# Patient Record
Sex: Male | Born: 1990 | Race: Black or African American | Hispanic: No | Marital: Single | Smoking: Current every day smoker
Health system: Southern US, Community
[De-identification: ages and names within clinical notes are randomized; demographics above are authoritative.]

---
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
## 2008-12-10 ENCOUNTER — Emergency Department (HOSPITAL_COMMUNITY): Admission: EM | Admit: 2008-12-10 | Discharge: 2008-12-10 | Payer: Self-pay | Admitting: Emergency Medicine

## 2020-06-18 ENCOUNTER — Other Ambulatory Visit: Payer: Self-pay

## 2020-06-18 ENCOUNTER — Emergency Department (HOSPITAL_COMMUNITY): Payer: Self-pay

## 2020-06-18 ENCOUNTER — Encounter (HOSPITAL_COMMUNITY): Payer: Self-pay | Admitting: Emergency Medicine

## 2020-06-18 ENCOUNTER — Emergency Department (HOSPITAL_COMMUNITY)
Admission: EM | Admit: 2020-06-18 | Discharge: 2020-06-18 | Disposition: A | Discharge status: Home / Self Care | Payer: Self-pay | Attending: Emergency Medicine | Admitting: Emergency Medicine

## 2020-06-18 DIAGNOSIS — Z5321 Procedure and treatment not carried out due to patient leaving prior to being seen by health care provider: Secondary | ICD-10-CM | POA: Insufficient documentation

## 2020-06-18 DIAGNOSIS — R0989 Other specified symptoms and signs involving the circulatory and respiratory systems: Secondary | ICD-10-CM | POA: Insufficient documentation

## 2020-06-18 DIAGNOSIS — R0981 Nasal congestion: Secondary | ICD-10-CM | POA: Insufficient documentation

## 2020-06-18 DIAGNOSIS — R05 Cough: Secondary | ICD-10-CM | POA: Insufficient documentation

## 2020-06-18 NOTE — ED Notes (Addendum)
Pt informed non-clinical staff he checked out.

## 2020-06-18 NOTE — ED Triage Notes (Signed)
Patient reports productive cough with chest congestion and nasal congestion this week , no fever or chills .

## 2020-07-08 ENCOUNTER — Telehealth (HOSPITAL_COMMUNITY): Payer: Self-pay

## 2020-07-21 ENCOUNTER — Encounter (HOSPITAL_COMMUNITY): Payer: Self-pay | Admitting: Psychiatry

## 2020-07-21 ENCOUNTER — Other Ambulatory Visit: Payer: Self-pay

## 2020-07-21 ENCOUNTER — Ambulatory Visit (INDEPENDENT_AMBULATORY_CARE_PROVIDER_SITE_OTHER): Payer: No Payment, Other | Admitting: Psychiatry

## 2020-07-21 DIAGNOSIS — F4310 Post-traumatic stress disorder, unspecified: Secondary | ICD-10-CM | POA: Insufficient documentation

## 2020-07-21 DIAGNOSIS — F39 Unspecified mood [affective] disorder: Secondary | ICD-10-CM | POA: Insufficient documentation

## 2020-07-21 MED ORDER — CITALOPRAM HYDROBROMIDE 10 MG PO TABS: 10.0000 mg | ORAL_TABLET | Freq: Every day | ORAL | 1 refills | Status: DC

## 2020-07-21 MED ORDER — ARIPIPRAZOLE 5 MG PO TABS: 5.0000 mg | ORAL_TABLET | Freq: Every day | ORAL | 1 refills | Status: DC

## 2020-07-21 NOTE — Progress Notes (Signed)
Psychiatric Initial Adult Assessment   Patient Identification: [name] MRN:  [id number] Date of Evaluation:  07/21/2020   Referral Source: Vesta Mixer  Chief Complaint:   " I want to be back on my Adderall. Xanax helped me great as well."  Visit Diagnosis:    ICD-10-CM   1. Unspecified mood (affective) disorder (HCC)  F39 ARIPiprazole (ABILIFY) 5 MG tablet    citalopram (CELEXA) 10 MG tablet  2. PTSD (post-traumatic stress disorder)  F43.10 citalopram (CELEXA) 10 MG tablet    History of Present Illness:  Patient presents today for initial psychiatric evaluation for medication management.  Patient was referred to outpatient psychiatry from [facility].  Patient reports a history of ADHD and PTSD.  Patient reports that he has recently experienced mood swings, increased irritability, hearing voices of people that are not there, paranoia about people saying things about him, being easily startled by loud noises, and feeling like he is always being watched.   Patient also endorses experiencing moments when he becomes so upset that he "blacks out" and he is unable to control his anger.  Patient reports that he is primarily concerned about his irritability and mood swings today because he is currently on house arrest after serving 3 years in prison and he does not want to go back to prison due to his uncontrollable behaviors.  He reports that he was laid off from his job due to an angry outburst towards another staff member while working.  He reports that he has not been on any medications since being released from prison in February 2021. He stated that he was prescribed Hydroxyzine and Remeron when he was in prison but they did not help him much.  Patient reported that he took Celexa in the past which was effective for his mood and anxiety.   Provider offered to start patient on Celexa and Abilify to manage his mood swings and reduce his irritability, auditory hallucinations, and paranoia.  Patient is agreeable with starting Celexa and Abilify medications at this time.  Patient reports a history of daily cocaine use and use of ecstasy in the past.  Patient reports that he has not used any cocaine in 3 weeks.  However, patient reports that he currently smokes marijuana.  Patient encouraged to discontinue marijuana use and continue abstinence from other illicit substance use.   Patient endorses difficulty concentrating and reports that in the past he was prescribed Adderall medication for ADHD.  Patient would like to be prescribed Adderall medication at this time. Provider discussed in depth with the patient about the contraindication to prescribe addictive controlled substances such as Adderall due to his history of illicit substance use.  Patient reports that he would still like to be prescribed Adderall because if he does not receive the prescription for the medication, he may seek the medication elsewhere.  Provider discussed with the patient that prescribing this medication is contraindicated at this time.  Patient then verbalized his understanding and reported that he may be willing to try something else.    In addition, patient reported that he has been diagnosed with lung cancer and he would like to be referred to oncology.  Provider discussed with the patient that this type of referral is usually made by PCP. Patient stated that he does not have a PCP at this time, he was then provided with information for [facility] to schedule an appointment for further evaluation and referral.     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  Substance Abuse History in the last 12 months:  Yes.    Consequences of Substance Abuse: Negative  Past Medical History: No past medical history on file. No past surgical  history on file.  Family Psychiatric History: denied  Family History: No family history on file.  Social History:   Social History   Socioeconomic History  . Marital status: Single    Spouse name: Not on file  . Number of children: Not on file  . Years of education: Not on file  . Highest education level: Not on file  Occupational History  . Not on file  Tobacco Use  . Smoking status: Never Smoker  . Smokeless tobacco: Never Used  Substance and Sexual Activity  . Alcohol use: Never  . Drug use: Yes    Types: Marijuana  . Sexual activity: Not on file  Other Topics Concern  . Not on file  Social History Narrative  . Not on file   Social Determinants of Health   Financial Resource Strain:   . Difficulty of Paying Living Expenses:   Food Insecurity:   . Worried About Programme researcher, broadcasting/film/video in the Last Year:   . Barista in the Last Year:   Transportation Needs:   . Freight forwarder (Medical):   Marland Kitchen Lack of Transportation (Non-Medical):   Physical Activity:   . Days of Exercise per Week:   . Minutes of Exercise per Session:   Stress:   . Feeling of Stress :   Social Connections:   . Frequency of Communication with Friends and Family:   . Frequency of Social Gatherings with Friends and Family:   . Attends Religious Services:   . Active Member of Clubs or Organizations:   . Attends Banker Meetings:   Marland Kitchen Marital Status:     Additional Social History: Currently on house arrest  Allergies:  No Known Allergies  Metabolic Disorder Labs: No results found for: HGBA1C, MPG No results found for: PROLACTIN No results found for: CHOL, TRIG, HDL, CHOLHDL, VLDL, LDLCALC No results found for: TSH  Therapeutic Level Labs: No results found for: LITHIUM No results found for: CBMZ No results found for: VALPROATE  Current Medications: Current Outpatient Medications  Medication Sig Dispense Refill  . ARIPiprazole (ABILIFY) 5 MG tablet Take 1 tablet (5  mg total) by mouth daily. 30 tablet 1  . citalopram (CELEXA) 10 MG tablet Take 1 tablet (10 mg total) by mouth daily. 30 tablet 1   No current facility-administered medications for this visit.    Musculoskeletal: Strength & Muscle Tone: within normal limits Gait & Station: normal Patient leans: N/A  Psychiatric Specialty Exam: Review of Systems  There were no vitals taken for this visit.There is no height or weight on file to calculate BMI.  General Appearance: Fairly Groomed  Eye Contact:  Good  Speech:  Clear and Coherent and Normal Rate  Volume:  Normal  Mood:  Euthymic  Affect:  Congruent  Thought Process:  Goal Directed and Descriptions of Associations: Intact  Orientation:  Full (Time, Place, and Person)  Thought Content:  Logical, fixated on getting prescription for Adderall.  Suicidal Thoughts:  No  Homicidal Thoughts:  No  Memory:  Immediate;   Good Recent;   Good  Judgement:  Fair  Insight:  Lacking  Psychomotor Activity:  Normal  Concentration:  Concentration: Good and Attention Span: Good  Recall:  Good  Fund of Knowledge:Good  Language: Good  Akathisia:  Negative  Handed:  Right  AIMS (if indicated):  not done  Assets:  Communication Skills Desire for Improvement Financial Resources/Insurance Housing Social Support  ADL's:  Intact  Cognition: WNL  Sleep:  Good     Assessment and Plan: 29 year old male with history of unspecified mood disorder and PTSD now coming in and requesting prescription for Adderall as he claims that his attention span is very poor and he did not complete any tasks.  He also reported mood swings and irritability.  He is currently on house arrest after getting out of prison in February after spending 3 years there.  He stated that he would like to try medication that can help him keep his temper under control so that he does not get in any further trouble.  He was recommended trial of Abilify to help with mood swings and also Celexa.   He reported that he likes it the past and it was helpful with his mood. Potential side effects of medication and risks vs benefits of treatment vs non-treatment were explained and discussed. All questions were answered.  Patient was informed that he will not be prescribed Adderall due to his history of substance abuse.  1. Unspecified mood (affective) disorder (HCC) - ARIPiprazole (ABILIFY) 5 MG tablet; Take 1 tablet (5 mg total) by mouth daily.  Dispense: 30 tablet; Refill: 1 - citalopram (CELEXA) 10 MG tablet; Take 1 tablet (10 mg total) by mouth daily.  Dispense: 30 tablet; Refill: 1  2. PTSD (post-traumatic stress disorder) - citalopram (CELEXA) 10 MG tablet; Take 1 tablet (10 mg total) by mouth daily.  Dispense: 30 tablet; Refill: 1  Follow-up in 6 weeks.  Zena Amos, MD 7/26/20211:22 PM

## 2020-08-12 ENCOUNTER — Ambulatory Visit (INDEPENDENT_AMBULATORY_CARE_PROVIDER_SITE_OTHER): Payer: Self-pay | Admitting: Student

## 2020-08-12 ENCOUNTER — Other Ambulatory Visit: Payer: Self-pay

## 2020-08-12 ENCOUNTER — Encounter: Payer: Self-pay | Admitting: Student

## 2020-08-12 VITALS — BP 127/78 | HR 64 | Temp 98.4°F | Ht 69.0 in | Wt 243.1 lb

## 2020-08-12 DIAGNOSIS — F39 Unspecified mood [affective] disorder: Secondary | ICD-10-CM

## 2020-08-12 DIAGNOSIS — R05 Cough: Secondary | ICD-10-CM

## 2020-08-12 DIAGNOSIS — F4310 Post-traumatic stress disorder, unspecified: Secondary | ICD-10-CM

## 2020-08-12 DIAGNOSIS — R059 Cough, unspecified: Secondary | ICD-10-CM

## 2020-08-12 NOTE — Assessment & Plan Note (Addendum)
[name] has had a multiple episodes of cough with black phlegm over the course of 7 months. He notes worsening night sweats the last two months. He denies weight loss or other systemic symptoms such as fever, chills. He states he was told while incarcerated that he had lung cancer and was told to follow up with [facility], but he states he never followed up. Patient continues to have episodes of cough with black phlegm. Patient stated he was tested, multiple times while in prison and prior to being released, for tuberculosis via PPD and was never told if he was positive.   Patient was seen in the ED for chest pain on 06/21, chest x-ray was performed and the impression was as stated: "The heart size and mediastinal contours are within normal limits. Both lungs are clear. The visualized skeletal structures are unremarkable." I also reviewed the chest x-ray and did not appreciate any cardiopulmonary disease. No evidence of infiltrated with or without cavitary lesions. Patient's pulmonary exam today was unremarkable   Differential includes tuberculosis, lung cancer, pulmonary infectious etiology, autoimmune disorders. Patient's history of smoking cigarettes, marijuana, synthetic cannabinoids, and prior use of cocaine may also be contributing to his symptoms.   A/P - Will wait to review patient's medical records from [facility]. He does not currently have insurance and do not believe it would be beneficial to repeat labs, imaging, etc that was already performed. Process to retrieve records began today. Patient signed medical release paperwork to be faxed to [facility] - Once these records are reviewed and depending on what tests were already performed and what cancer was suspected, we will consider repeating PPD and ordering Chest CT as well as further evaluation.

## 2020-08-12 NOTE — Progress Notes (Signed)
   CC: Cough  HPI:  [name] is a 29 y.o. M who presents to the clinic today for evaluation of a cough with black phlegm. The patient states this began in 01/2020 when he began coughing up black phlegm while he was incarcerated. The patient notes being worked up by the medical staff at the [facility] and states he was diagnosed with cancer and given a referral to [facility]. He denies following up with this appointment. He states since then, he has not coughed up black phlegm until recently. He notes prior to being released from prison he was given a TB skin test that was negative and he was tested monthly while he was in prison. He notes continuing to have the cough and that at times he would have chest pain. He also notes shortness of breath that improved when he was able to cough up phlegm.   He denies coughing up light red or dark red tinted or streaked phlegm. He states he has a history of night sweats that has worsened over the past two months. He denies weight loss, fever, or chills.   Patient notes he has a history of smoking 10 cigarettes for 2 week intervals prior to being incarcerated 15 years ago. He notes smoking marijuana 4 times a day.   Patient states he has a PMHx of PTSD and depression, he is currently followed by [facility]   No past medical history on file.   Review of Systems:  As Per HPI  Physical Exam:  Vitals:   08/12/20 1011  BP: 127/78  Pulse: 64  Temp: 98.4 F (36.9 C)  TempSrc: Oral  SpO2: 99%  Weight: 243 lb 1.6 oz (110.3 kg)  Height: 5\' 9"  (1.753 m)   Physical Exam Vitals and nursing note reviewed.  Constitutional:      General: He is not in acute distress.    Appearance: Normal appearance. He is not ill-appearing or toxic-appearing.  HENT:     Head: Atraumatic.  Eyes:     Extraocular Movements: Extraocular movements intact.  Cardiovascular:     Rate and Rhythm: Normal rate and regular rhythm.      Pulses: Normal pulses.     Heart sounds: Normal heart sounds. No murmur heard.  No friction rub. No gallop.   Pulmonary:     Effort: Pulmonary effort is normal. No respiratory distress.     Breath sounds: Normal breath sounds. No stridor. No wheezing, rhonchi or rales.  Musculoskeletal:     Cervical back: Normal range of motion.  Neurological:     General: No focal deficit present.     Mental Status: He is alert and oriented to person, place, and time. Mental status is at baseline.  Psychiatric:        Mood and Affect: Mood normal.        Behavior: Behavior normal.     Assessment & Plan:   See Encounters Tab for problem based charting.  Patient discussed with and seen by Dr. 

## 2020-08-12 NOTE — Patient Instructions (Signed)
Thank you for allowing [place] to assist in your care today! It was nice to meet you.   Today we discussed:  1) Cough - We will be retrieving the records from [facility]. Once I review these records we will call you with the next step in our plan.  - If you have any worsening cough, fever, chills, worsening chest pain, please go to your closest emergency room or call 911  2) Pyschiatry Referral - I will be putting in a referral for psychiatry for you to follow up with.   If you have any questions or concerns please do not hesitate to call!

## 2020-08-12 NOTE — Assessment & Plan Note (Signed)
Patient requested to see different psychologist for his PTSD and depression  A/P  - Psych referral in place - Continue medications of celexa and abilify.

## 2020-08-14 NOTE — Progress Notes (Signed)
Internal Medicine Clinic Attending  I saw and evaluated the patient.  I personally confirmed the key portions of the history and exam documented by Dr. Katsadouros and I reviewed pertinent patient test results.  The assessment, diagnosis, and plan were formulated together and I agree with the documentation in the resident's note.  

## 2020-08-29 ENCOUNTER — Ambulatory Visit (HOSPITAL_COMMUNITY): Payer: No Payment, Other | Admitting: Psychiatry

## 2020-09-04 ENCOUNTER — Telehealth: Payer: Self-pay | Admitting: *Deleted

## 2020-09-04 NOTE — Telephone Encounter (Signed)
TC to patient, he is asking if we ever received him medical records from [facility] (Pt had an office visit on 08/12/20).  This RN did not see any records in media. Pt states if we did not get records, he would like to come in and get the labwork he and the MD spoke about at LOV. Will forward to yellow team and ask Mamie to call for records. Thank you, SChaplin, RN,BSN

## 2020-09-04 NOTE — Telephone Encounter (Signed)
Please call patient about getting labs per the patient's girlfriend.

## 2020-09-04 NOTE — Telephone Encounter (Signed)
[name] will call patient to confirm if he has signed the release of information form. If not, he can come in to sign or we can send the form out.

## 2020-09-05 ENCOUNTER — Telehealth: Payer: Self-pay

## 2020-09-10 ENCOUNTER — Encounter: Payer: Self-pay | Admitting: Licensed Clinical Social Worker

## 2020-09-15 ENCOUNTER — Encounter: Payer: Self-pay | Admitting: Internal Medicine

## 2020-09-15 ENCOUNTER — Other Ambulatory Visit: Payer: Self-pay

## 2020-09-15 ENCOUNTER — Telehealth: Payer: Self-pay | Admitting: *Deleted

## 2020-09-15 NOTE — Telephone Encounter (Signed)
Patient's fiance called in stating patient's cough is no better. Coughing all night long. Denies SHOB. Wondering if we have received lab results from [facility]. These are not in Media. Appt given this afternoon with [facility] Team as Yellow Team has no openings. She will call back if unable to keep this appt. Kinnie Feil, BSN, RN-BC

## 2020-09-15 NOTE — Assessment & Plan Note (Deleted)
Pt reporting dx of lung cancer while incarcerated.

## 2020-09-15 NOTE — Telephone Encounter (Signed)
Ok. Will discuss at the visit today. Thanks

## 2020-09-15 NOTE — Telephone Encounter (Signed)
Spoke with [name] at [facility]. States she does not have any records of lab results, imaging, or immunizations which means they were not performed at the [facility]. Attending made aware. Kinnie Feil, BSN, RN-BC

## 2020-09-15 NOTE — Telephone Encounter (Signed)
ROI faxed again to [facility] at [phone]. Kinnie Feil, BSN, RN-BC

## 2020-09-15 NOTE — Telephone Encounter (Signed)
Per Victorino Dike they only have a TB record, will be faxing that over. [phone]

## 2020-10-29 NOTE — Telephone Encounter (Signed)
closed

## 2020-11-11 ENCOUNTER — Encounter: Payer: Self-pay | Admitting: Student

## 2020-11-11 ENCOUNTER — Telehealth: Payer: Self-pay | Admitting: *Deleted

## 2020-11-11 NOTE — Telephone Encounter (Signed)
Call to patient at both available numbers.  Home number male answered said patient did not live there.  Message left on Mobile number that Clinics had called about missed appointment.   Angelina Ok, RN 11/11/2020 4:03 PM.

## 2021-01-26 ENCOUNTER — Ambulatory Visit: Payer: Self-pay | Admitting: Internal Medicine

## 2021-02-03 ENCOUNTER — Ambulatory Visit: Payer: Self-pay | Admitting: Internal Medicine

## 2021-06-06 ENCOUNTER — Emergency Department (HOSPITAL_COMMUNITY)
Admission: EM | Admit: 2021-06-06 | Discharge: 2021-06-07 | Disposition: A | Discharge status: Home / Self Care | Payer: Self-pay | Attending: Emergency Medicine | Admitting: Emergency Medicine

## 2021-06-06 ENCOUNTER — Other Ambulatory Visit: Payer: Self-pay

## 2021-06-06 DIAGNOSIS — Z5321 Procedure and treatment not carried out due to patient leaving prior to being seen by health care provider: Secondary | ICD-10-CM | POA: Insufficient documentation

## 2021-06-06 DIAGNOSIS — Z202 Contact with and (suspected) exposure to infections with a predominantly sexual mode of transmission: Secondary | ICD-10-CM | POA: Insufficient documentation

## 2021-06-06 LAB — URINALYSIS, ROUTINE W REFLEX MICROSCOPIC
Bacteria, UA: NONE SEEN
Bilirubin Urine: NEGATIVE
Glucose, UA: NEGATIVE mg/dL
Hgb urine dipstick: NEGATIVE
Ketones, ur: NEGATIVE mg/dL
Nitrite: NEGATIVE
Protein, ur: NEGATIVE mg/dL
Specific Gravity, Urine: 1.017 (ref 1.005–1.030)
pH: 6 (ref 5.0–8.0)

## 2021-06-06 LAB — HIV ANTIBODY (ROUTINE TESTING W REFLEX): HIV Screen 4th Generation wRfx: NONREACTIVE

## 2021-06-06 NOTE — ED Provider Notes (Signed)
Emergency Medicine Provider Triage Evaluation Note  [name] , Little 29 y.o. male  was evaluated in triage.  Pt complains of requesting STD testing.  No symptoms. No known exposures  Review of Systems  Positive: STD test Negative: Hematuria, dysuria, rash, lesions, vaginal discharge  Physical Exam  There were no vitals taken for this visit. Gen:   Awake, no distress   Resp:  Normal effort  MSK:   Moves extremities without difficulty  Other:    Medical Decision Making  Medically screening exam initiated at 8:42 PM.  Appropriate orders placed.  [name] was informed that the remainder of the evaluation will be completed by another provider, this initial triage assessment does not replace that evaluation, and the importance of remaining in the ED until their evaluation is complete.  STD testing currently asymptomatic    Henry Ruff A, PA-C 06/06/21 2042    Henry Core, MD 06/07/21 1056

## 2021-06-06 NOTE — ED Triage Notes (Signed)
Pt arrive POV, requesting STD check.

## 2021-06-07 LAB — RPR: RPR Ser Ql: NONREACTIVE

## 2021-08-13 ENCOUNTER — Ambulatory Visit (INDEPENDENT_AMBULATORY_CARE_PROVIDER_SITE_OTHER): Payer: No Payment, Other | Admitting: Physician Assistant

## 2021-08-13 ENCOUNTER — Other Ambulatory Visit: Payer: Self-pay

## 2021-08-13 VITALS — BP 126/87 | HR 63 | Ht 69.0 in | Wt 235.0 lb

## 2021-08-13 DIAGNOSIS — F99 Mental disorder, not otherwise specified: Secondary | ICD-10-CM

## 2021-08-13 DIAGNOSIS — F39 Unspecified mood [affective] disorder: Secondary | ICD-10-CM

## 2021-08-13 DIAGNOSIS — F5105 Insomnia due to other mental disorder: Secondary | ICD-10-CM | POA: Diagnosis not present

## 2021-08-13 DIAGNOSIS — F4310 Post-traumatic stress disorder, unspecified: Secondary | ICD-10-CM | POA: Diagnosis not present

## 2021-08-13 DIAGNOSIS — G4700 Insomnia, unspecified: Secondary | ICD-10-CM | POA: Insufficient documentation

## 2021-08-13 DIAGNOSIS — F411 Generalized anxiety disorder: Secondary | ICD-10-CM | POA: Diagnosis not present

## 2021-08-13 MED ORDER — FLUOXETINE HCL 10 MG PO CAPS
10.0000 mg | ORAL_CAPSULE | Freq: Every day | ORAL | 0 refills | Status: DC
  Filled 2021-08-13: qty 30, 30d supply, fill #0

## 2021-08-13 MED ORDER — TRAZODONE HCL 50 MG PO TABS
25.0000 mg | ORAL_TABLET | Freq: Every day | ORAL | 1 refills | Status: DC
  Filled 2021-08-13: qty 15, 15d supply, fill #0
  Filled 2021-09-07 – 2021-09-22 (×2): qty 15, 15d supply, fill #1

## 2021-08-13 MED ORDER — ARIPIPRAZOLE 5 MG PO TABS
5.0000 mg | ORAL_TABLET | Freq: Every day | ORAL | 1 refills | Status: DC
  Filled 2021-08-13: qty 30, 30d supply, fill #0
  Filled 2021-09-07 – 2021-11-02 (×2): qty 30, 30d supply, fill #1

## 2021-08-13 MED ORDER — FLUOXETINE HCL 20 MG PO CAPS
20.0000 mg | ORAL_CAPSULE | Freq: Every day | ORAL | 1 refills | Status: DC
  Filled 2021-08-13: qty 30, 30d supply, fill #0
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
## 2021-08-15 ENCOUNTER — Encounter (HOSPITAL_COMMUNITY): Payer: Self-pay | Admitting: Physician Assistant

## 2021-08-28 ENCOUNTER — Telehealth (HOSPITAL_COMMUNITY): Payer: Self-pay | Admitting: Physician Assistant

## 2021-08-28 NOTE — Telephone Encounter (Signed)
Patient reports 3 episodes of hallucinations since starting trazadone. Patient says his mind is all over the place, and his dream state is not real sleep it is scary hallucinations fighting things and people and the same when awake. Patient requesting different medication that he used to take as a child.

## 2021-09-04 ENCOUNTER — Other Ambulatory Visit (HOSPITAL_COMMUNITY): Payer: Self-pay | Admitting: Physician Assistant

## 2021-09-04 DIAGNOSIS — F4310 Post-traumatic stress disorder, unspecified: Secondary | ICD-10-CM

## 2021-09-04 DIAGNOSIS — F39 Unspecified mood [affective] disorder: Secondary | ICD-10-CM

## 2021-09-04 DIAGNOSIS — F411 Generalized anxiety disorder: Secondary | ICD-10-CM

## 2021-09-04 DIAGNOSIS — F909 Attention-deficit hyperactivity disorder, unspecified type: Secondary | ICD-10-CM

## 2021-09-04 MED ORDER — FLUOXETINE HCL 10 MG PO CAPS
10.0000 mg | ORAL_CAPSULE | Freq: Every day | ORAL | 1 refills | Status: DC
  Filled 2021-09-04 – 2021-09-07 (×2): qty 30, 30d supply, fill #0

## 2021-09-04 MED ORDER — ATOMOXETINE HCL 40 MG PO CAPS
40.0000 mg | ORAL_CAPSULE | Freq: Every day | ORAL | 1 refills | Status: AC
  Filled 2021-09-04: qty 30, 30d supply, fill #0

## 2021-09-04 NOTE — Telephone Encounter (Signed)
Provider was contacted by Lauralee Evener regarding patient's concern over taking trazodone. Provider contacted patient to discuss his concerns. Patient reports that he has had no issues with his trazodone and that it has been helpful with his sleep. Patient expresses that he thinks his fluoxetine may be too strong. Patient was instructed to taper back down on his dose of Fluoxetine from 20 mg to 10 mg.  Patient also expresses concern over his symptoms related to ADHD. Provider recommended patient start out on Strattera 40 mg daily for the manage of his ADHD. Patient is agreeable to recommendation. Patient's medication to be e-prescribed to pharmacy of choice.

## 2021-09-04 NOTE — Progress Notes (Signed)
Provider contacted patient and patient's medication regimen was adjusted accordingly. Patient's medication to be e-prescribed to pharmacy of choice.

## 2021-09-07 ENCOUNTER — Ambulatory Visit (INDEPENDENT_AMBULATORY_CARE_PROVIDER_SITE_OTHER): Payer: No Payment, Other | Admitting: Licensed Clinical Social Worker

## 2021-09-07 ENCOUNTER — Other Ambulatory Visit: Payer: Self-pay

## 2021-09-07 DIAGNOSIS — F4310 Post-traumatic stress disorder, unspecified: Secondary | ICD-10-CM | POA: Diagnosis not present
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
## 2021-09-14 ENCOUNTER — Other Ambulatory Visit: Payer: Self-pay

## 2021-09-22 ENCOUNTER — Other Ambulatory Visit: Payer: Self-pay

## 2021-09-23 ENCOUNTER — Encounter (HOSPITAL_COMMUNITY): Payer: No Payment, Other | Admitting: Physician Assistant

## 2021-09-24 ENCOUNTER — Telehealth (HOSPITAL_COMMUNITY): Payer: Self-pay | Admitting: Physician Assistant

## 2021-09-24 NOTE — Telephone Encounter (Signed)
Per spouse, patient became incarcerated on 09/21/21. Wife concerned of side effects of medication.  Wife phone number [phone]

## 2021-10-06 ENCOUNTER — Encounter (HOSPITAL_COMMUNITY): Payer: No Payment, Other | Admitting: Physician Assistant

## 2021-10-08 NOTE — Telephone Encounter (Signed)
Provider was contacted by Lauralee Evener and Orpah Clinton. Reola Calkins, RN regarding message left by this patient's wife. Message is in relation to patient's medication side effects. Provider will reach out to patient's wife regarding concerns.

## 2021-10-14 ENCOUNTER — Encounter (HOSPITAL_COMMUNITY): Payer: Self-pay | Admitting: Physician Assistant

## 2021-10-14 ENCOUNTER — Other Ambulatory Visit: Payer: Self-pay

## 2021-10-14 ENCOUNTER — Telehealth (INDEPENDENT_AMBULATORY_CARE_PROVIDER_SITE_OTHER): Payer: No Payment, Other | Admitting: Physician Assistant

## 2021-10-14 DIAGNOSIS — F411 Generalized anxiety disorder: Secondary | ICD-10-CM | POA: Diagnosis not present

## 2021-10-14 DIAGNOSIS — G4700 Insomnia, unspecified: Secondary | ICD-10-CM

## 2021-10-14 DIAGNOSIS — F39 Unspecified mood [affective] disorder: Secondary | ICD-10-CM

## 2021-10-14 DIAGNOSIS — F4310 Post-traumatic stress disorder, unspecified: Secondary | ICD-10-CM | POA: Diagnosis not present

## 2021-10-14 DIAGNOSIS — F909 Attention-deficit hyperactivity disorder, unspecified type: Secondary | ICD-10-CM | POA: Diagnosis not present

## 2021-10-14 MED ORDER — FLUOXETINE HCL 40 MG PO CAPS
40.0000 mg | ORAL_CAPSULE | Freq: Every day | ORAL | 1 refills | Status: DC
  Filled 2021-10-14 – 2022-01-07 (×2): qty 30, 30d supply, fill #0

## 2021-10-14 MED ORDER — AMITRIPTYLINE HCL 25 MG PO TABS
25.0000 mg | ORAL_TABLET | Freq: Every day | ORAL | 1 refills | Status: DC
  Filled 2021-10-14: qty 60, 33d supply, fill #0

## 2021-10-14 MED ORDER — LAMOTRIGINE 25 MG PO TABS
25.0000 mg | ORAL_TABLET | Freq: Every day | ORAL | 1 refills | Status: AC
  Filled 2021-10-14: qty 30, 30d supply, fill #0

## 2021-10-14 MED ORDER — AMPHETAMINE-DEXTROAMPHETAMINE 5 MG PO TABS: 5.0000 mg | ORAL_TABLET | Freq: Every day | ORAL | 0 refills | Status: DC
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
## 2021-10-20 ENCOUNTER — Ambulatory Visit (HOSPITAL_COMMUNITY)
Admission: EM | Admit: 2021-10-20 | Discharge: 2021-10-20 | Discharge status: Absent without leave | Payer: No Payment, Other

## 2021-10-21 ENCOUNTER — Telehealth (HOSPITAL_COMMUNITY): Payer: Self-pay

## 2021-10-21 NOTE — BH Assessment (Signed)
Care Management - Follow Up [facility] Discharges   Writer attempted to make contact with patient today and was unsuccessful.  Writer left a HIPPA compliant voice message.   

## 2021-10-30 ENCOUNTER — Other Ambulatory Visit: Payer: Self-pay

## 2021-11-02 ENCOUNTER — Other Ambulatory Visit: Payer: Self-pay

## 2021-11-04 ENCOUNTER — Ambulatory Visit: Payer: Self-pay | Admitting: Nurse Practitioner

## 2021-11-09 ENCOUNTER — Ambulatory Visit (HOSPITAL_COMMUNITY): Payer: No Payment, Other | Admitting: Licensed Clinical Social Worker

## 2021-11-10 ENCOUNTER — Encounter: Payer: Self-pay | Admitting: Nurse Practitioner

## 2021-11-10 NOTE — Progress Notes (Signed)
No answer. LVM ?

## 2021-11-10 NOTE — Progress Notes (Signed)
No answer on home number. Unable to leave VM

## 2021-11-11 ENCOUNTER — Telehealth (HOSPITAL_COMMUNITY): Payer: Self-pay | Admitting: Physician Assistant

## 2021-11-11 ENCOUNTER — Other Ambulatory Visit (HOSPITAL_COMMUNITY): Payer: Self-pay | Admitting: Physician Assistant

## 2021-11-11 DIAGNOSIS — F909 Attention-deficit hyperactivity disorder, unspecified type: Secondary | ICD-10-CM

## 2021-11-11 NOTE — Telephone Encounter (Signed)
Patient called requesting refills on medication amphetamine-dextroamphetamine (ADDERALL) 5 MG tablet Wants to discuss increasing medications.
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
## 2021-11-13 ENCOUNTER — Other Ambulatory Visit (HOSPITAL_COMMUNITY): Payer: Self-pay | Admitting: Physician Assistant

## 2021-11-13 DIAGNOSIS — F909 Attention-deficit hyperactivity disorder, unspecified type: Secondary | ICD-10-CM

## 2021-11-13 MED ORDER — AMPHETAMINE-DEXTROAMPHETAMINE 5 MG PO TABS: 5.0000 mg | ORAL_TABLET | Freq: Every day | ORAL | 0 refills | Status: DC

## 2021-11-13 NOTE — Telephone Encounter (Signed)
Patient present to clinic requesting that Adderall dosage be adjusted. Patient's prescription to remain the same. Provider to contact patient to discuss other alternatives to medications.
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
## 2021-11-17 ENCOUNTER — Telehealth (HOSPITAL_COMMUNITY): Payer: Self-pay | Admitting: Physician Assistant

## 2021-11-17 ENCOUNTER — Other Ambulatory Visit (HOSPITAL_COMMUNITY): Payer: Self-pay | Admitting: Physician Assistant

## 2021-11-17 DIAGNOSIS — F909 Attention-deficit hyperactivity disorder, unspecified type: Secondary | ICD-10-CM

## 2021-11-17 MED ORDER — AMPHETAMINE-DEXTROAMPHETAMINE 5 MG PO TABS: 5.0000 mg | ORAL_TABLET | Freq: Every day | ORAL | 0 refills | Status: AC

## 2021-11-17 NOTE — Telephone Encounter (Signed)
[name] is Out of stock of ADDERALL at [address]. Pt requesting provider send to see [facility] on [address] or [address].  Pt [phone].

## 2021-11-17 NOTE — Progress Notes (Signed)
Provider was contacted by Orpah Clinton. Reola Calkins, RN and Lauralee Evener regarding patient's request for medication be sent to different pharmacy. Patient's medication to be e-prescribed to preferred pharmacy.

## 2021-11-17 NOTE — Telephone Encounter (Signed)
Provider was contacted by Suzanne K. Beck, RN and Carla Bracken regarding patient's request for medication be sent to different pharmacy. Patient's medication to be e-prescribed to preferred pharmacy. 

## 2021-11-23 ENCOUNTER — Other Ambulatory Visit: Payer: Self-pay

## 2021-11-23 ENCOUNTER — Ambulatory Visit (INDEPENDENT_AMBULATORY_CARE_PROVIDER_SITE_OTHER): Payer: No Payment, Other | Admitting: Licensed Clinical Social Worker

## 2021-11-23 DIAGNOSIS — F4310 Post-traumatic stress disorder, unspecified: Secondary | ICD-10-CM | POA: Diagnosis not present
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
## 2021-11-26 ENCOUNTER — Telehealth (HOSPITAL_COMMUNITY): Payer: Self-pay | Admitting: Physician Assistant

## 2021-11-26 NOTE — Telephone Encounter (Signed)
Provider was contacted by Lauralee Evener regarding patient's message. Provider to contact patient to discuss medication options.

## 2021-11-26 NOTE — Telephone Encounter (Signed)
Patient stopped by to apologize to provider for "speaking to him like that, I truly apologize."  Pt request ask Link Snuffer to call him [phone] to discuss medication adjustments.

## 2021-11-27 ENCOUNTER — Emergency Department (HOSPITAL_COMMUNITY)
Admission: EM | Admit: 2021-11-27 | Discharge: 2021-11-27 | Disposition: A | Discharge status: Home / Self Care | Payer: Self-pay | Attending: Emergency Medicine | Admitting: Emergency Medicine

## 2021-11-27 ENCOUNTER — Encounter (HOSPITAL_COMMUNITY): Payer: Self-pay | Admitting: Emergency Medicine

## 2021-11-27 DIAGNOSIS — Z008 Encounter for other general examination: Secondary | ICD-10-CM

## 2021-11-27 DIAGNOSIS — Z79899 Other long term (current) drug therapy: Secondary | ICD-10-CM | POA: Insufficient documentation

## 2021-11-27 DIAGNOSIS — F17210 Nicotine dependence, cigarettes, uncomplicated: Secondary | ICD-10-CM | POA: Insufficient documentation

## 2021-11-27 DIAGNOSIS — Z0279 Encounter for issue of other medical certificate: Secondary | ICD-10-CM | POA: Insufficient documentation

## 2021-11-27 MED ORDER — DIPHENHYDRAMINE HCL 50 MG/ML IJ SOLN
50.0000 mg | Freq: Once | INTRAMUSCULAR | Status: AC
  Administered 2021-11-27: 50 mg via INTRAMUSCULAR
  Filled 2021-11-27: qty 1

## 2021-11-27 MED ORDER — LORAZEPAM 2 MG/ML IJ SOLN
2.0000 mg | Freq: Once | INTRAMUSCULAR | Status: AC
  Administered 2021-11-27: 2 mg via INTRAMUSCULAR
  Filled 2021-11-27: qty 1

## 2021-11-27 NOTE — ED Notes (Signed)
RN able to talk to pt calmly. Pt seems agreeable to obtain vitals. Pt c/o of restraints being too tight. RN assessed. Pt hand looked slightly swollen at this time, RN removed tighter 2nd set of restraints. Pt states, " that's much better". While RN in another pt room, pt able to take off 1st set of restraints. Pt eloped.
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
## 2021-11-27 NOTE — ED Notes (Addendum)
Unable to obtain vitals, pt extremely combative, able to pull off lines even with violent restraints. RN will attempt to get again after pt medicated.
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
## 2021-11-27 NOTE — ED Notes (Signed)
Pt now resting. Normal rise and chest fall.

## 2021-11-27 NOTE — ED Notes (Signed)
Pt with [employer] and uncrooptive for vitals
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
## 2021-11-29 ENCOUNTER — Emergency Department (HOSPITAL_COMMUNITY): Payer: Self-pay

## 2021-11-29 ENCOUNTER — Other Ambulatory Visit: Payer: Self-pay

## 2021-11-29 ENCOUNTER — Emergency Department (HOSPITAL_COMMUNITY)
Admission: EM | Admit: 2021-11-29 | Discharge: 2021-11-29 | Disposition: A | Discharge status: Home / Self Care | Payer: Self-pay | Attending: Emergency Medicine | Admitting: Emergency Medicine

## 2021-11-29 DIAGNOSIS — R21 Rash and other nonspecific skin eruption: Secondary | ICD-10-CM | POA: Insufficient documentation

## 2021-11-29 DIAGNOSIS — R059 Cough, unspecified: Secondary | ICD-10-CM | POA: Insufficient documentation

## 2021-11-29 DIAGNOSIS — M79606 Pain in leg, unspecified: Secondary | ICD-10-CM | POA: Insufficient documentation

## 2021-11-29 DIAGNOSIS — Z79899 Other long term (current) drug therapy: Secondary | ICD-10-CM | POA: Insufficient documentation

## 2021-11-29 DIAGNOSIS — F17210 Nicotine dependence, cigarettes, uncomplicated: Secondary | ICD-10-CM | POA: Insufficient documentation

## 2021-11-29 LAB — COMPREHENSIVE METABOLIC PANEL
ALT: 32 U/L (ref 0–44)
AST: 53 U/L — ABNORMAL HIGH (ref 15–41)
Albumin: 4 g/dL (ref 3.5–5.0)
Alkaline Phosphatase: 67 U/L (ref 38–126)
Anion gap: 12 (ref 5–15)
BUN: 19 mg/dL (ref 6–20)
CO2: 24 mmol/L (ref 22–32)
Calcium: 9.1 mg/dL (ref 8.9–10.3)
Chloride: 100 mmol/L (ref 98–111)
Creatinine, Ser: 1.5 mg/dL — ABNORMAL HIGH (ref 0.61–1.24)
GFR, Estimated: >60 mL/min (ref >60)
Glucose, Bld: 94 mg/dL (ref 70–99)
Potassium: 4.1 mmol/L (ref 3.5–5.1)
Sodium: 136 mmol/L (ref 135–145)
Total Bilirubin: 1 mg/dL (ref 0.3–1.2)
Total Protein: 7.4 g/dL (ref 6.5–8.1)

## 2021-11-29 LAB — CBC WITH DIFFERENTIAL/PLATELET
Abs Immature Granulocytes: 0.05 10*3/uL (ref 0.00–0.07)
Basophils Absolute: 0 10*3/uL (ref 0.0–0.1)
Basophils Relative: 0 %
Eosinophils Absolute: 0 10*3/uL (ref 0.0–0.5)
Eosinophils Relative: 0 %
HCT: 44.7 % (ref 39.0–52.0)
Hemoglobin: 15.1 g/dL (ref 13.0–17.0)
Immature Granulocytes: 0 %
Lymphocytes Relative: 10 %
Lymphs Abs: 1.5 10*3/uL (ref 0.7–4.0)
MCH: 31.8 pg (ref 26.0–34.0)
MCHC: 33.8 g/dL (ref 30.0–36.0)
MCV: 94.1 fL (ref 80.0–100.0)
Monocytes Absolute: 0.9 10*3/uL (ref 0.1–1.0)
Monocytes Relative: 6 %
Neutro Abs: 12 10*3/uL — ABNORMAL HIGH (ref 1.7–7.7)
Neutrophils Relative %: 84 %
Platelets: 386 10*3/uL (ref 150–400)
RBC: 4.75 MIL/uL (ref 4.22–5.81)
RDW: 13.1 % (ref 11.5–15.5)
WBC: 14.5 10*3/uL — ABNORMAL HIGH (ref 4.0–10.5)
nRBC: 0 % (ref 0.0–0.2)

## 2021-11-29 LAB — SALICYLATE LEVEL: Salicylate Lvl: <7 mg/dL — ABNORMAL LOW (ref 7.0–30.0)

## 2021-11-29 LAB — RAPID URINE DRUG SCREEN, HOSP PERFORMED
Amphetamines: NOT DETECTED
Barbiturates: NOT DETECTED
Benzodiazepines: NOT DETECTED
Cocaine: POSITIVE — AB
Opiates: NOT DETECTED
Tetrahydrocannabinol: POSITIVE — AB

## 2021-11-29 LAB — URINALYSIS, ROUTINE W REFLEX MICROSCOPIC
Bilirubin Urine: NEGATIVE
Glucose, UA: NEGATIVE mg/dL
Hgb urine dipstick: NEGATIVE
Ketones, ur: 20 mg/dL — AB
Leukocytes,Ua: NEGATIVE
Nitrite: NEGATIVE
Protein, ur: NEGATIVE mg/dL
Specific Gravity, Urine: 1.027 (ref 1.005–1.030)
pH: 5 (ref 5.0–8.0)

## 2021-11-29 LAB — ETHANOL: Alcohol, Ethyl (B): <10 mg/dL (ref <10)

## 2021-11-29 LAB — ACETAMINOPHEN LEVEL: Acetaminophen (Tylenol), Serum: <10 ug/mL — ABNORMAL LOW (ref 10–30)

## 2021-11-29 MED ORDER — STERILE WATER FOR INJECTION IJ SOLN
INTRAMUSCULAR | Status: AC
  Filled 2021-11-29: qty 10

## 2021-11-29 MED ORDER — ZIPRASIDONE MESYLATE 20 MG IM SOLR
20.0000 mg | Freq: Once | INTRAMUSCULAR | Status: DC
  Filled 2021-11-29: qty 20

## 2021-11-29 NOTE — ED Notes (Signed)
Pt wife at bedside.

## 2021-11-29 NOTE — ED Notes (Signed)
Pt came out of room aggressively and questioning the sitter in the hall why he was looking at his wife. Pt redirected to go back in his room. Pt went back in room.
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
## 2021-11-29 NOTE — ED Provider Notes (Signed)
[facility] EMERGENCY DEPARTMENT Provider Note   CSN: [id number] Arrival date & time: 11/29/21  9735     History Chief Complaint  Patient presents with   Leg Pain    Verlin Uher is a 30 y.o. male.  30 year old male presents via EMS for evaluation.  Patient does have psychiatric history.  Patient reports he was walking through the entire city of [place] since yesterday 7 a.m. to 2 AM last night.  He reports he was walking across the city to bless the city.  He reports after he completed the walk his feet were hurting.  He also endorses a cough, and also states there is a rash near his right buttock that occurred after he received an injection on Friday.  He reports he received the injection at this hospital and believes it was a lethal injection.  He reports his wife gave [facility] consent to give him lethal injection.  He denies any SI, HI.  He does state he hears God's voice but denies any visual hallucinations.  Reports he is on psychiatric medicines and he is typically compliant with those medicines except he has not taken those the past couple days.  The history is provided by the patient. No language interpreter was used.      No past medical history on file.  Patient Active Problem List   Diagnosis Date Noted   Attention deficit hyperactivity disorder (ADHD) 09/04/2021   Generalized anxiety disorder 08/13/2021   Insomnia 08/13/2021   Cough 08/12/2020   Unspecified mood (affective) disorder (HCC) 07/21/2020   PTSD (post-traumatic stress disorder) 07/21/2020    No past surgical history on file.     No family history on file.  Social History   Tobacco Use   Smoking status: Every Day    Types: Cigarettes   Smokeless tobacco: Former   Tobacco comments:    3 per day   Substance Use Topics   Alcohol use: Not Currently   Drug use: Yes    Types: Marijuana    Home Medications Prior to Admission medications   Medication Sig Start  Date End Date Taking? Authorizing Provider  amitriptyline (ELAVIL) 25 MG tablet Take 1 tablet (25 mg total) at bedtime for 6 days then continue taking 2 tablets (50 mg total) at bedtime 10/14/21   Nwoko, Stephens Shire E, PA  amphetamine-dextroamphetamine (ADDERALL) 5 MG tablet Take 1 tablet (5 mg total) by mouth daily. 11/17/21 11/17/22  Nwoko, Tommas Olp, PA  ARIPiprazole (ABILIFY) 5 MG tablet Take 1 tablet (5 mg total) by mouth daily. 08/13/21   Nwoko, Tommas Olp, PA  atomoxetine (STRATTERA) 40 MG capsule Take 1 capsule (40 mg total) by mouth daily. 09/04/21 09/04/22  Nwoko, Tommas Olp, PA  FLUoxetine (PROZAC) 40 MG capsule Take 1 capsule (40 mg total) by mouth daily. 10/14/21 10/14/22  Nwoko, Tommas Olp, PA  lamoTRIgine (LAMICTAL) 25 MG tablet Take 1 tablet (25 mg total) by mouth daily. 10/14/21 10/14/22  Meta Hatchet, PA    Allergies    Patient has no known allergies.  Review of Systems   Review of Systems  Constitutional:  Negative for chills and fever.  Respiratory:  Positive for cough. Negative for shortness of breath.   Genitourinary:  Negative for dysuria.  Skin:  Positive for rash. Negative for wound.  Psychiatric/Behavioral:  Positive for hallucinations. Negative for agitation.   All other systems reviewed and are negative.  Physical Exam Updated Vital Signs BP (!) 138/95 (BP Location: Right  Arm)   Pulse 86   Temp 98.2 F (36.8 C)   Resp 18   SpO2 100%   Physical Exam Vitals and nursing note reviewed.  Constitutional:      General: He is not in acute distress.    Appearance: Normal appearance. He is not ill-appearing.  HENT:     Head: Normocephalic and atraumatic.     Nose: Nose normal.  Eyes:     General: No scleral icterus.    Extraocular Movements: Extraocular movements intact.     Conjunctiva/sclera: Conjunctivae normal.  Cardiovascular:     Rate and Rhythm: Normal rate and regular rhythm.     Pulses: Normal pulses.     Heart sounds: Normal heart sounds.  Pulmonary:      Effort: Pulmonary effort is normal. No respiratory distress.     Breath sounds: Normal breath sounds. No wheezing or rales.  Abdominal:     General: There is no distension.     Tenderness: There is no abdominal tenderness.  Musculoskeletal:        General: Normal range of motion.     Cervical back: Normal range of motion.  Skin:    General: Skin is warm and dry.     Comments: Without presence of rash at patient stated location of right buttocks.  Neurological:     General: No focal deficit present.     Mental Status: He is alert. Mental status is at baseline.    ED Results / Procedures / Treatments   Labs (all labs ordered are listed, but only abnormal results are displayed) Labs Reviewed  CBC WITH DIFFERENTIAL/PLATELET - Abnormal; Notable for the following components:      Result Value   WBC 14.5 (*)    Neutro Abs 12.0 (*)    All other components within normal limits  COMPREHENSIVE METABOLIC PANEL - Abnormal; Notable for the following components:   Creatinine, Ser 1.50 (*)    AST 53 (*)    All other components within normal limits  SALICYLATE LEVEL - Abnormal; Notable for the following components:   Salicylate Lvl <7.0 (*)    All other components within normal limits  ACETAMINOPHEN LEVEL - Abnormal; Notable for the following components:   Acetaminophen (Tylenol), Serum <10 (*)    All other components within normal limits  URINALYSIS, ROUTINE W REFLEX MICROSCOPIC - Abnormal; Notable for the following components:   APPearance HAZY (*)    Ketones, ur 20 (*)    All other components within normal limits  ETHANOL  RAPID URINE DRUG SCREEN, HOSP PERFORMED    EKG None  Radiology No results found.  Procedures Procedures   Medications Ordered in ED Medications - No data to display  ED Course  I have reviewed the triage vital signs and the nursing notes.  Pertinent labs & imaging results that were available during my care of the patient were reviewed by me and  considered in my medical decision making (see chart for details).    MDM Rules/Calculators/A&P                           30 year old male presents via EMS for evaluation.  Patient states he walked around the entire city of [place] to [place] the city.  Patient reports a rash which is not present on exam.  He does state he was given a lethal injection on Friday per his wife's consent.  He does have a cough and his white  count is elevated.  Will evaluate with chest x-ray, and respiratory panel.  We will also obtain UA.  Patient while awaiting further work-up and psychiatric evaluation left out of the emergency room AMA.  His chest x-ray was without pneumonia, and UA without UTI.  Patient is not IVC and is not homicidal or suicidal.  Final Clinical Impression(s) / ED Diagnoses Final diagnoses:  None    Rx / DC Orders ED Discharge Orders     None        Marita Kansas, PA-C 11/29/21 1001    Benjiman Core, MD 11/29/21 1919
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
## 2021-11-29 NOTE — ED Notes (Signed)
Pt stating he wanted to leave, provider notified and advised that was okay per [employer]. Pt was escorted out by security.

## 2021-11-29 NOTE — ED Notes (Signed)
Pt walking around yelling at this writer. Pt stated , "Do you want me to tell all of them about what y'all did to me yesterday?"

## 2021-11-29 NOTE — ED Notes (Signed)
Pt refusing covid swab. States he can't have a swab up his nose. Pt given [place] sandwich and drink.

## 2021-11-29 NOTE — ED Notes (Signed)
Pt came back out of room stating that "this woman makes me this way" pt obviously getting agitated and wife was asked to leave and she stated she would. Wife appeared scared.

## 2021-11-29 NOTE — ED Triage Notes (Signed)
Pt arrived by EMS for leg pain after walking since 7am. EMS reported depression, pt denies SI

## 2021-11-30 ENCOUNTER — Telehealth: Payer: Self-pay | Admitting: *Deleted

## 2021-11-30 NOTE — Telephone Encounter (Signed)
Pt wife called regarding pt being allowed to leave hospital AMA.  RNCM reviewed chart to find that " Patient had an ankle bracelet on for monitoring and had been issued a citation by police. Police left the department to follow after the patient. He was not under arrest or IVC." and therefore was able to leave AMA.

## 2021-12-02 ENCOUNTER — Ambulatory Visit (HOSPITAL_COMMUNITY)
Admission: EM | Admit: 2021-12-02 | Discharge: 2021-12-02 | Discharge status: Against Medical Advice | Payer: No Payment, Other

## 2021-12-02 DIAGNOSIS — F1410 Cocaine abuse, uncomplicated: Secondary | ICD-10-CM

## 2021-12-02 DIAGNOSIS — F39 Unspecified mood [affective] disorder: Secondary | ICD-10-CM

## 2021-12-02 DIAGNOSIS — F22 Delusional disorders: Secondary | ICD-10-CM

## 2021-12-02 NOTE — ED Provider Notes (Signed)
Behavioral Health Urgent Care Medical Screening Exam  Patient Name: [name] MRN: [id number] Date of Evaluation: 12/02/21 Chief Complaint:   Diagnosis:  Final diagnoses:  Unspecified mood (affective) disorder (HCC)  Paranoia (psychosis) (HCC)  Cocaine abuse (HCC)    History of Present illness: [name] is a 30 y.o. male.  Patient presents voluntarily to [facility] behavioral health for walk-in assessment.  He states "I am trying to get some help, I am trying to get some medicine that works."  [name] has been diagnosed with ADHD, PTSD, unspecified mood disorder, generalized anxiety disorder and insomnia.  He is followed by outpatient psychiatry at [facility] behavioral health.  He reports he is typically compliant with medications including Celexa, Prozac, Adderall and Abilify.  He stopped taking medications 4 days ago as he has misplaced his medications, after being incarcerated.  He reports frustration with outpatient provider as he "does not manage my Adderall right, I told him to up my milligrams, 5 mg is not enough."  Ruven endorses substance use, reports last use of marijuana, Adderall and cocaine on yesterday.  He denies alcohol use, he denies substance use aside from marijuana, Adderall and cocaine.  He reports he does not take Adderall as prescribed as he feels he needs to "double up the dose."  [name] reports recent stressors include 2 court dates, court upcoming on 12/03/2021 and 12/04/2021.  [name] presents with delusional thoughts, paranoid ideations and hyperreligiosity.  He reports he is currently homeless because his mother threw him out of her home and initiated a 50-B protective order approximately 1 week ago he states "I scared her, because I could read her mind."  He presents with delusional thoughts and tangential conversation, states "when I get mad I can change the weather, I am going to save the world because I am the son of  God."  He reports he was placed in jail 2 days ago after eluding police.  He reports he eluded police as he had to walk through the [place] in an effort to save the world.  [name] presents with hyperreligious conversation.  He states "I believe the Bible and I can help people."  Patient is assessed face-to-face by nurse practitioner.  He is seated in assessment area, no acute distress.  He is alert and oriented, cooperative during assessment.   He presents with labile mood, irritable affect. He denies suicidal and homicidal ideations.  He denies history of suicide attempts, denies history of self-harm.  He contracts verbally for safety with this Clinical research associate.   He denies both auditory and visual hallucinations.  Patient appears preoccupied with religious conversation.   [name] reports he is homeless for approximately 1 week after his mother would not permit him to return to her home.  He reports he has recently been residing with a friend who lives in a hotel.  He states "I will go back to my mom's tomorrow, she will remove the restraining order." He endorses average appetite and decreased sleep.  He endorses chronic insomnia. He discusses that only xanax has helped him sleep in the past.  Patient offered support and encouragement.  [name] insists that he needs to leave to "go smoke (cigarettes)." Attempted to review treatment plan including inpatient psychiatric hospitalization, patient demands to leave lobby.   Patient reviewed with outpatient provider who supports disposition of inpatient psychiatric treatment. Outpatient provider is aware of impulsive behavior and patient's recent involvement with local police related to behavioral outbursts.   Psychiatric Specialty Exam  Presentation  General Appearance:Appropriate for Environment; Casual  Eye Contact:Good  Speech:Clear and Coherent; Normal Rate  Speech Volume:Normal  Handedness:Right   Mood and Affect   Mood:Labile  Affect:Labile   Thought Process  Thought Processes:Coherent; Goal Directed  Descriptions of Associations:Tangential  Orientation:Full (Time, Place and Person)  Thought Content:Tangential; Paranoid Ideation; Abstract Reasoning  Diagnosis of Schizophrenia or Schizoaffective disorder in past: No  Duration of Psychotic Symptoms: Less than six months  Hallucinations:None  Ideas of Reference:Delusions; Paranoia  Suicidal Thoughts:No  Homicidal Thoughts:No   Sensorium  Memory:Recent Fair; Remote Fair  Judgment:Fair  Insight:Shallow   Executive Functions  Concentration:Fair  Attention Span:Fair  [name]  Language:Good   Psychomotor Activity  Psychomotor Activity:Normal   Assets  Assets:Communication Skills; Desire for Improvement; Physical Health; Resilience; Social Support   Sleep  Sleep:Poor  Number of hours: No data recorded  No data recorded  Physical Exam: Physical Exam Vitals and nursing note reviewed.  Constitutional:      Appearance: Normal appearance. He is well-developed.  HENT:     Head: Normocephalic and atraumatic.     Nose: Nose normal.  Cardiovascular:     Rate and Rhythm: Normal rate.  Pulmonary:     Effort: Pulmonary effort is normal.  Musculoskeletal:        General: Normal range of motion.     Cervical back: Normal range of motion.  Skin:    General: Skin is warm and dry.  Neurological:     Mental Status: He is alert and oriented to person, place, and time.  Psychiatric:        Attention and Perception: Attention normal.        Mood and Affect: Affect is labile.        Speech: Speech is tangential.        Behavior: Behavior is uncooperative.        Thought Content: Thought content is paranoid and delusional.        Cognition and Memory: Cognition normal.   Review of Systems  Constitutional: Negative.   HENT: Negative.    Eyes: Negative.   Respiratory: Negative.     Cardiovascular: Negative.   Gastrointestinal: Negative.   Genitourinary: Negative.   Musculoskeletal: Negative.   Skin: Negative.   Neurological: Negative.   Endo/Heme/Allergies: Negative.   Psychiatric/Behavioral:  Positive for substance abuse. The patient has insomnia.   Blood pressure 138/81, pulse 65, temperature 98.4 F (36.9 C), temperature source Oral, resp. rate 18, height 5\' 7"  (1.702 m), weight 222 lb (100.7 kg), SpO2 100 %. Body mass index is 34.77 kg/m.  Musculoskeletal: Strength & Muscle Tone: within normal limits Gait & Station: normal Patient leans: N/A   [facility] MSE Discharge Disposition for Follow up and Recommendations: Patient reviewed with Dr Serafina Mitchell. Inpatient psychiatric hospitalization recommended, Involuntary commitment petition completed, patient left [facility] prior to completion of involuntary commitment petition.    Lucky Rathke, FNP 12/02/2021, 11:38 AM

## 2021-12-02 NOTE — BH Assessment (Signed)
Pt reports getting out of jail 2 days ago due to "a disappearing act" or fleeing from police. Pt reports he spent 5 days in jail. Pt reports he has been off his medications for the past 3-4 days. Pt reports dx of PTSD, ADHD, bipolar schizophrenia and compulsive disorder. Pt reports he sees Eddie at [facility] but has not seen him in about a month. Pt reports he has court tomorrow for a 50B his mom placed on him due to arguments about the "Bible". Pt states that him and his mom got into a bad argument about a week and a half ago. Pt denies SI, HI, AVH and reports THC use yesterday. Pt reports not liking this time of year and states "fuck the world". Pt reports hx of AVH and reports that God talks to him but denies God telling him to do anything bad. Pt also reports hx of seeing aliens. Pt reports he lost his voice due to yelling while he was in jail. Pt reports that he recent received a shot from the hospital however he does not know what the shot was for. Pt asking to stay a couple of night to get back on his medications.    Pt is urgent

## 2021-12-07 ENCOUNTER — Ambulatory Visit (HOSPITAL_COMMUNITY): Payer: No Payment, Other | Admitting: Licensed Clinical Social Worker

## 2021-12-08 ENCOUNTER — Telehealth (HOSPITAL_COMMUNITY): Payer: Self-pay | Admitting: Physician Assistant

## 2021-12-08 NOTE — Telephone Encounter (Signed)
Patients sgo called to cancel future appts and he is unavailable to contact. She requested for his therapist and psychiatrist to contact her to discuss his treatment.

## 2021-12-10 ENCOUNTER — Telehealth (HOSPITAL_COMMUNITY): Payer: No Payment, Other | Admitting: Physician Assistant

## 2021-12-11 ENCOUNTER — Telehealth (HOSPITAL_COMMUNITY): Payer: Self-pay | Admitting: *Deleted

## 2021-12-11 ENCOUNTER — Telehealth (HOSPITAL_COMMUNITY): Payer: Self-pay

## 2021-12-11 NOTE — Telephone Encounter (Signed)
[name], sgo, is upset neither provider has returned her call regarding treatment plan after incarceration.

## 2021-12-11 NOTE — Telephone Encounter (Signed)
Patient's wife called stating patient has been incarcerated & will be released ~~ 01/04/22.  Patient's wife is a little disappointed that no one reached out to her after finding her husband has been put in jail. Patient's wife would like provider to reach to jail system to help coordinate medication care while incarcerated. Provider has been notified & has reached out to the wife.

## 2021-12-11 NOTE — BH Assessment (Signed)
Care Management - [facility] Follow Up Discharges   Writer attempted to make contact with patient today and was unsuccessful.  Writer left a HIPPA compliant voice message.   Per chart review, patient Left Message.  Patient has a follow up appointment with his established provider Link Snuffer, NP at [facility] on 2nd floor on 01-19-2022

## 2021-12-14 NOTE — Telephone Encounter (Signed)
LCSW not aware she called until just seeing this message. Also do not have pt's permission to communicate with her. He did not allow her into last session. Plan to call and listen to her  concerns.

## 2021-12-14 NOTE — Telephone Encounter (Signed)
Wife, Noralee Stain, [phone], stopping by to request Eddie call her today in follow-up from the phone conversation with Link Snuffer last Friday.

## 2021-12-14 NOTE — Telephone Encounter (Signed)
Wife, [name], [phone], stopping in for f/u assistance.

## 2021-12-15 ENCOUNTER — Telehealth (HOSPITAL_COMMUNITY): Payer: Self-pay | Admitting: Licensed Clinical Social Worker

## 2021-12-15 NOTE — Telephone Encounter (Signed)
Upon further review, spouse was notified to have the provider in prison system to call [facility] regarding medications and treatment. Spouse insists she can take "home medications" to the jail for pt to take. Informed spouse to allow providers to communicate with each other as needed for pt care while incarcerated.

## 2021-12-15 NOTE — Telephone Encounter (Signed)
Per admin staff spouse asking for a call from therapist and med provider. LCSW called pt this morning. Advised of limitations in communication based on pt's last appt and him disallowing spouse into session. She states spouse is "not in his right mind" and he is currently incarcerated. Spouse is concerned about him getting "the right meds" and getting too much medication as he is being prescribed some meds from a provider in the prison system. Discussed in general that someone incarcerated would not have access to home meds. Spouse reports she took home meds to the jail. Spouse asking for communication between providers. Asked spouse to have the provider in prison system to call [facility]. Spouse verbalizes understanding.

## 2022-01-07 ENCOUNTER — Other Ambulatory Visit: Payer: Self-pay

## 2022-01-08 ENCOUNTER — Telehealth (HOSPITAL_COMMUNITY): Payer: Self-pay | Admitting: Physician Assistant

## 2022-01-08 ENCOUNTER — Other Ambulatory Visit: Payer: Self-pay

## 2022-01-08 ENCOUNTER — Other Ambulatory Visit (HOSPITAL_COMMUNITY): Payer: Self-pay | Admitting: Physician Assistant

## 2022-01-08 ENCOUNTER — Encounter (HOSPITAL_COMMUNITY): Payer: Self-pay | Admitting: Physician Assistant

## 2022-01-08 ENCOUNTER — Telehealth (INDEPENDENT_AMBULATORY_CARE_PROVIDER_SITE_OTHER): Payer: No Payment, Other | Admitting: Physician Assistant

## 2022-01-08 DIAGNOSIS — G4700 Insomnia, unspecified: Secondary | ICD-10-CM | POA: Diagnosis not present

## 2022-01-08 DIAGNOSIS — F39 Unspecified mood [affective] disorder: Secondary | ICD-10-CM

## 2022-01-08 DIAGNOSIS — F4310 Post-traumatic stress disorder, unspecified: Secondary | ICD-10-CM

## 2022-01-08 DIAGNOSIS — F411 Generalized anxiety disorder: Secondary | ICD-10-CM

## 2022-01-08 MED ORDER — ARIPIPRAZOLE 5 MG PO TABS
5.0000 mg | ORAL_TABLET | Freq: Every day | ORAL | 1 refills | Status: AC
  Filled 2022-01-08: qty 30, 30d supply, fill #0

## 2022-01-08 MED ORDER — FLUOXETINE HCL 20 MG PO CAPS
20.0000 mg | ORAL_CAPSULE | Freq: Every day | ORAL | 1 refills | Status: AC
  Filled 2022-01-08: qty 30, 30d supply, fill #0

## 2022-01-08 NOTE — Progress Notes (Signed)
Provider was contacted by Earnestine Mealing and Lauralee Evener regarding patient's request for medications to be sent to pharmacy before it closed. Patient's medications to be e-prescribed to pharmacy of choice.

## 2022-01-08 NOTE — Progress Notes (Signed)
BH MD/PA/NP OP Progress Note  Virtual Visit via Telephone Note  I connected with [name] on 01/08/22 at  5:00 PM EST by telephone and verified that I am speaking with the correct person using two identifiers.  Location: Patient: Home Provider: Clinic   I discussed the limitations, risks, security and privacy concerns of performing an evaluation and management service by telephone and the availability of in person appointments. I also discussed with the patient that there may be a patient responsible charge related to this service. The patient expressed understanding and agreed to proceed.  Follow Up Instructions:  I discussed the assessment and treatment plan with the patient. The patient was provided an opportunity to ask questions and all were answered. The patient agreed with the plan and demonstrated an understanding of the instructions.   The patient was advised to call back or seek an in-person evaluation if the symptoms worsen or if the condition fails to improve as anticipated.  I provided 26 minutes of non-face-to-face time during this encounter.  Meta Hatchet, PA   01/08/2022 5:51 PM [name]  MRN:  [id number]  Chief Complaint: Follow up and medication management  HPI:   [name] is a 31 year old male with a past psychiatric history significant for unspecified mood (affective) disorder, PTSD, generalized anxiety disorder, and insomnia who presents to [facility] via virtual telephone visit for follow-up and medication management.  Patient was recently released from jail on January 9th, 2023.  Patient is currently being managed on the following medications:  Abilify 5 mg daily Prozac 20 mg daily  Patient was able to pick up his prescriptions today.  Patient still continues to endorse issues with focus and a request to be placed on Adderall.  Patient was informed by provider that he is  unable to be placed on Adderall due to past history of drug use.  Provider informed patient that he confessed to using Adderall, cocaine, and marijuana a day prior to presenting to [facility] on 12/02/2021.  Patient denies reporting that he had used cocaine or marijuana.  Patient would not be receiving Adderall due to past history of drug use.  Patient denies experiencing depressive episodes and he further denies anxiety.  Patient denies any new stressors at this time.  He reports that he has issues with his sleep and states that he only is able to receive 30 minutes of sleep at a time.  He states that the most that he has slept and given span of time is 2 hours.  Patient states that he has been on amitriptyline in the past for the management of his sleep with no success.  He also states that trazodone was not helpful in the management of his sleep.  A PHQ-9 screen was performed with the patient scoring a 20.  A GAD-7 screen was also performed with the patient scoring a 17.  Patient is alert and oriented x4, calm, cooperative, and fully engaged in conversation during the encounter.  Patient endorses all right mood.  Patient denies suicidal ideations.  He denies homicidal ideations but states that if anyone were to try to hurt him he would hurt them before he got the chance.  Patient denies auditory or visual hallucinations and does not appear to be responding to internal/external stimuli.  Patient endorses poor sleep and receives on average 30 minutes of sleep at a time.  Patient endorses good appetite and eats on average 3  meals per day.  Patient denies alcohol consumption and illicit drug use.  Patient endorses tobacco use and smokes on average 5 to 6 cigarettes/day.  Visit Diagnosis:    ICD-10-CM   1. Unspecified mood (affective) disorder (HCC)  F39     2. PTSD (post-traumatic stress disorder)  F43.10 mirtazapine (REMERON) 7.5 MG tablet    3. Generalized anxiety disorder  F41.1  mirtazapine (REMERON) 7.5 MG tablet    4. Insomnia, unspecified type  G47.00 mirtazapine (REMERON) 7.5 MG tablet      Past Psychiatric History:  Attention deficit hyperactivity disorder (ADHD) PTSD Unspecified mood disorder Generalized anxiety disorder Insomnia  Past Medical History: History reviewed. No pertinent past medical history. History reviewed. No pertinent surgical history.  Family Psychiatric History:  Father - Agricultural consultantchizophrenic/bipolar Mother - Agricultural consultantchizophrenic/bipolar Brother (eldest brother) - Schizophrenia  Family History: History reviewed. No pertinent family history.  Social History:  Social History   Socioeconomic History   Marital status: Single    Spouse name: Not on file   Number of children: Not on file   Years of education: Not on file   Highest education level: Not on file  Occupational History   Not on file  Tobacco Use   Smoking status: Every Day    Types: Cigarettes   Smokeless tobacco: Former   Tobacco comments:    3 per day   Substance and Sexual Activity   Alcohol use: Not Currently   Drug use: Yes    Types: Marijuana   Sexual activity: Not Currently  Other Topics Concern   Not on file  Social History Narrative   Not on file   Social Determinants of Health   Financial Resource Strain: Not on file  Food Insecurity: Not on file  Transportation Needs: Not on file  Physical Activity: Not on file  Stress: Not on file  Social Connections: Not on file    Allergies: No Known Allergies  Metabolic Disorder Labs: No results found for: HGBA1C, MPG No results found for: PROLACTIN No results found for: CHOL, TRIG, HDL, CHOLHDL, VLDL, LDLCALC No results found for: TSH  Therapeutic Level Labs: No results found for: LITHIUM No results found for: VALPROATE No components found for:  CBMZ  Current Medications: Current Outpatient Medications  Medication Sig Dispense Refill   mirtazapine (REMERON) 7.5 MG tablet Take 1 tablet (7.5 mg total)  by mouth at bedtime. 30 tablet 1   amphetamine-dextroamphetamine (ADDERALL) 5 MG tablet Take 1 tablet (5 mg total) by mouth daily. 30 tablet 0   ARIPiprazole (ABILIFY) 5 MG tablet Take 1 tablet (5 mg total) by mouth daily. 30 tablet 1   atomoxetine (STRATTERA) 40 MG capsule Take 1 capsule (40 mg total) by mouth daily. 30 capsule 1   FLUoxetine (PROZAC) 20 MG capsule Take 1 capsule (20 mg total) by mouth daily. 30 capsule 1   lamoTRIgine (LAMICTAL) 25 MG tablet Take 1 tablet (25 mg total) by mouth daily. 30 tablet 1   No current facility-administered medications for this visit.     Musculoskeletal: Strength & Muscle Tone: Unable to assess due to telemedicine visit Gait & Station: Unable to assess due to telemedicine visit Patient leans: Unable to assess due to telemedicine visit  Psychiatric Specialty Exam: Review of Systems  Psychiatric/Behavioral:  Positive for sleep disturbance. Negative for decreased concentration, dysphoric mood, hallucinations, self-injury and suicidal ideas. The patient is nervous/anxious. The patient is not hyperactive.    There were no vitals taken for this visit.There is no height  or weight on file to calculate BMI.  General Appearance: Unable to assess due to telemedicine visit  Eye Contact:  Unable to assess due to telemedicine visit  Speech:  Clear and Coherent and Normal Rate  Volume:  Normal  Mood:  Anxious, Depressed, and Irritable  Affect:  Congruent  Thought Process:  Coherent, Goal Directed, and Descriptions of Associations: Intact  Orientation:  Full (Time, Place, and Person)  Thought Content: WDL   Suicidal Thoughts:  No  Homicidal Thoughts:  No  Memory:  Immediate;   Good Recent;   Good Remote;   Good  Judgement:  Good  Insight:  Fair  Psychomotor Activity:  Normal  Concentration:  Concentration: Fair and Attention Span: Fair  Recall:  [employer] of Knowledge: Fair  Language: Good  Akathisia:  NA  Handed:  Right  AIMS (if indicated):  not done  Assets:  Communication Skills Desire for Improvement Housing Social Support  ADL's:  Intact  Cognition: WNL  Sleep:  Poor   Screenings: GAD-7    Flowsheet Row Video Visit from 01/08/2022 in [facility] Video Visit from 10/14/2021 in [facility] Office Visit from 08/13/2021 in [facility]  Total GAD-7 Score 17 21 19       PHQ2-9    Flowsheet Row Video [facility]/08[facility]20[facility] Video Visit from 10[facility]14/2021 in [facility] Counselor from 09/07/2021 in [facility] Office Visit from 08/13/2021 in [facility] Office Visit from 08/12/2020 in [facility]  PHQ-2 Total Score 4 6 6 5 3   PHQ-9 Total Score 20 22 22 22 16       Flowsheet Row Video Visit from 01/08/2022 in [facility] ED from 11/29/2021 in [facility] EMERGENCY DEPARTMENT ED from 11/27/2021 in [facility] EMERGENCY DEPARTMENT  C-SSRS RISK CATEGORY Low Risk No Risk No Risk        Assessment and Plan:   Edahi Kroening. Little is a 31 year old male with a past psychiatric history significant for unspecified mood (affective) disorder, PTSD, generalized anxiety disorder, and insomnia who presents to [facility] via virtual telephone visit for follow-up and medication management.  Patient denies depressive episodes or anxiety, however, patient scored a 20 on his PHQ-9 and a 17 on his GAD-7 screen.  Patient states that he continues to have issues with his sleep and states that trazodone and amitriptyline have not been helpful in the past.  Provider recommended patient be placed on Remeron 7.5 mg at bedtime for the management of his sleep.  Patient became somewhat irritable when he discovered that the  medication is an antidepressant and states that he does not need to be on an antidepressant.  Provider informed patient that the medication is often used for sleep disturbances and insomnia.  After some explanation, patient agreed to take the medication.  Patient's medication to be e-prescribed to pharmacy of choice.  1. Unspecified mood (affective) disorder (HCC) Patient to continue taking Abilify 5 mg daily for the management of his unspecified mood disorder Patient to continue taking fluoxetine 20 mg daily for the management of his unspecified mood disorder  2. PTSD (post-traumatic stress disorder) Patient to continue taking fluoxetine 20 mg daily for the management of his unspecified mood disorder  3. Generalized anxiety disorder Patient to continue taking fluoxetine 20 mg daily for  the management of his generalized anxiety disorder  4. Insomnia, unspecified type  - mirtazapine (REMERON) 7.5 MG tablet; Take 1 tablet (7.5 mg total) by mouth at bedtime.  Dispense: 30 tablet; Refill: 1  Patient to follow up in 2 months Provider spent a total of 26 minutes with the patient/reviewing patient's chart  Meta HatchetUchenna E Tanyah Debruyne, PA 01/08/2022, 5:51 PM

## 2022-01-09 MED ORDER — MIRTAZAPINE 7.5 MG PO TABS
7.5000 mg | ORAL_TABLET | Freq: Every day | ORAL | 1 refills | Status: AC
  Filled 2022-01-09: qty 30, 30d supply, fill #0

## 2022-01-11 ENCOUNTER — Other Ambulatory Visit: Payer: Self-pay

## 2022-01-11 NOTE — Telephone Encounter (Signed)
Provider was contacted by [employer] regarding patient's wife's concern over outburst. Provider contacted patient to discuss concerns. Patient was irritable during the conversation. When asked if he had any outbursts, patient replied with "No." Provider informed patient that his wife had concerns over his outbursts to which he replied, "What outburst, was it because I said I was Jesus, the son of God." Provider informed patient that his wife was concerned that people may not respond well to his behavior and that he wanted to help. Patient replied "I told you what I needed. I need Xanax to help me sleep, but you won't let me have it because it is controlled substance." Provider asked if the patient had picked up his prescription for Remeron, the medication prescribed to him to aid him in sleep." Patient replied with "no." Provider informed patient to pick up the medication at his pharmacy. Patient then responded "If this medication doesn't help, then we'll see who  really is helping who." Provider asked patient what he meant by that statement, to which he replied "What do you mean? You are, trying to help me right? Well if the medication doesn't help, we'll see if you are really trying to help me."

## 2022-01-11 NOTE — Telephone Encounter (Signed)
Patients wife contacted office regarding her concern for the patient's outburst. Patient's wife has questions about patients medication. Says his outbursts have become worse and she is concerned for his safety. "I'm his wife, so I can deal with it. Other people out here aren't going to put up with it and I don't want him hurt anyone or vice versa". Had questions about LAI abilify.

## 2022-01-18 ENCOUNTER — Other Ambulatory Visit: Payer: Self-pay

## 2022-01-19 ENCOUNTER — Telehealth (HOSPITAL_COMMUNITY): Payer: No Payment, Other | Admitting: Physician Assistant

## 2022-02-18 ENCOUNTER — Ambulatory Visit (HOSPITAL_COMMUNITY): Payer: No Payment, Other | Admitting: Licensed Clinical Social Worker

## 2022-03-03 ENCOUNTER — Telehealth (HOSPITAL_COMMUNITY): Payer: No Payment, Other | Admitting: Physician Assistant

## 2023-10-25 IMAGING — CR DG CHEST 2V
2 series · 2 of 2 positions shown · non-contrast
Comparison: Chest x-ray dated 06/18/2020.

CLINICAL DATA: Cough for several days.  Altered mental status.

EXAM:
CHEST - 2 VIEW

[chest lat]
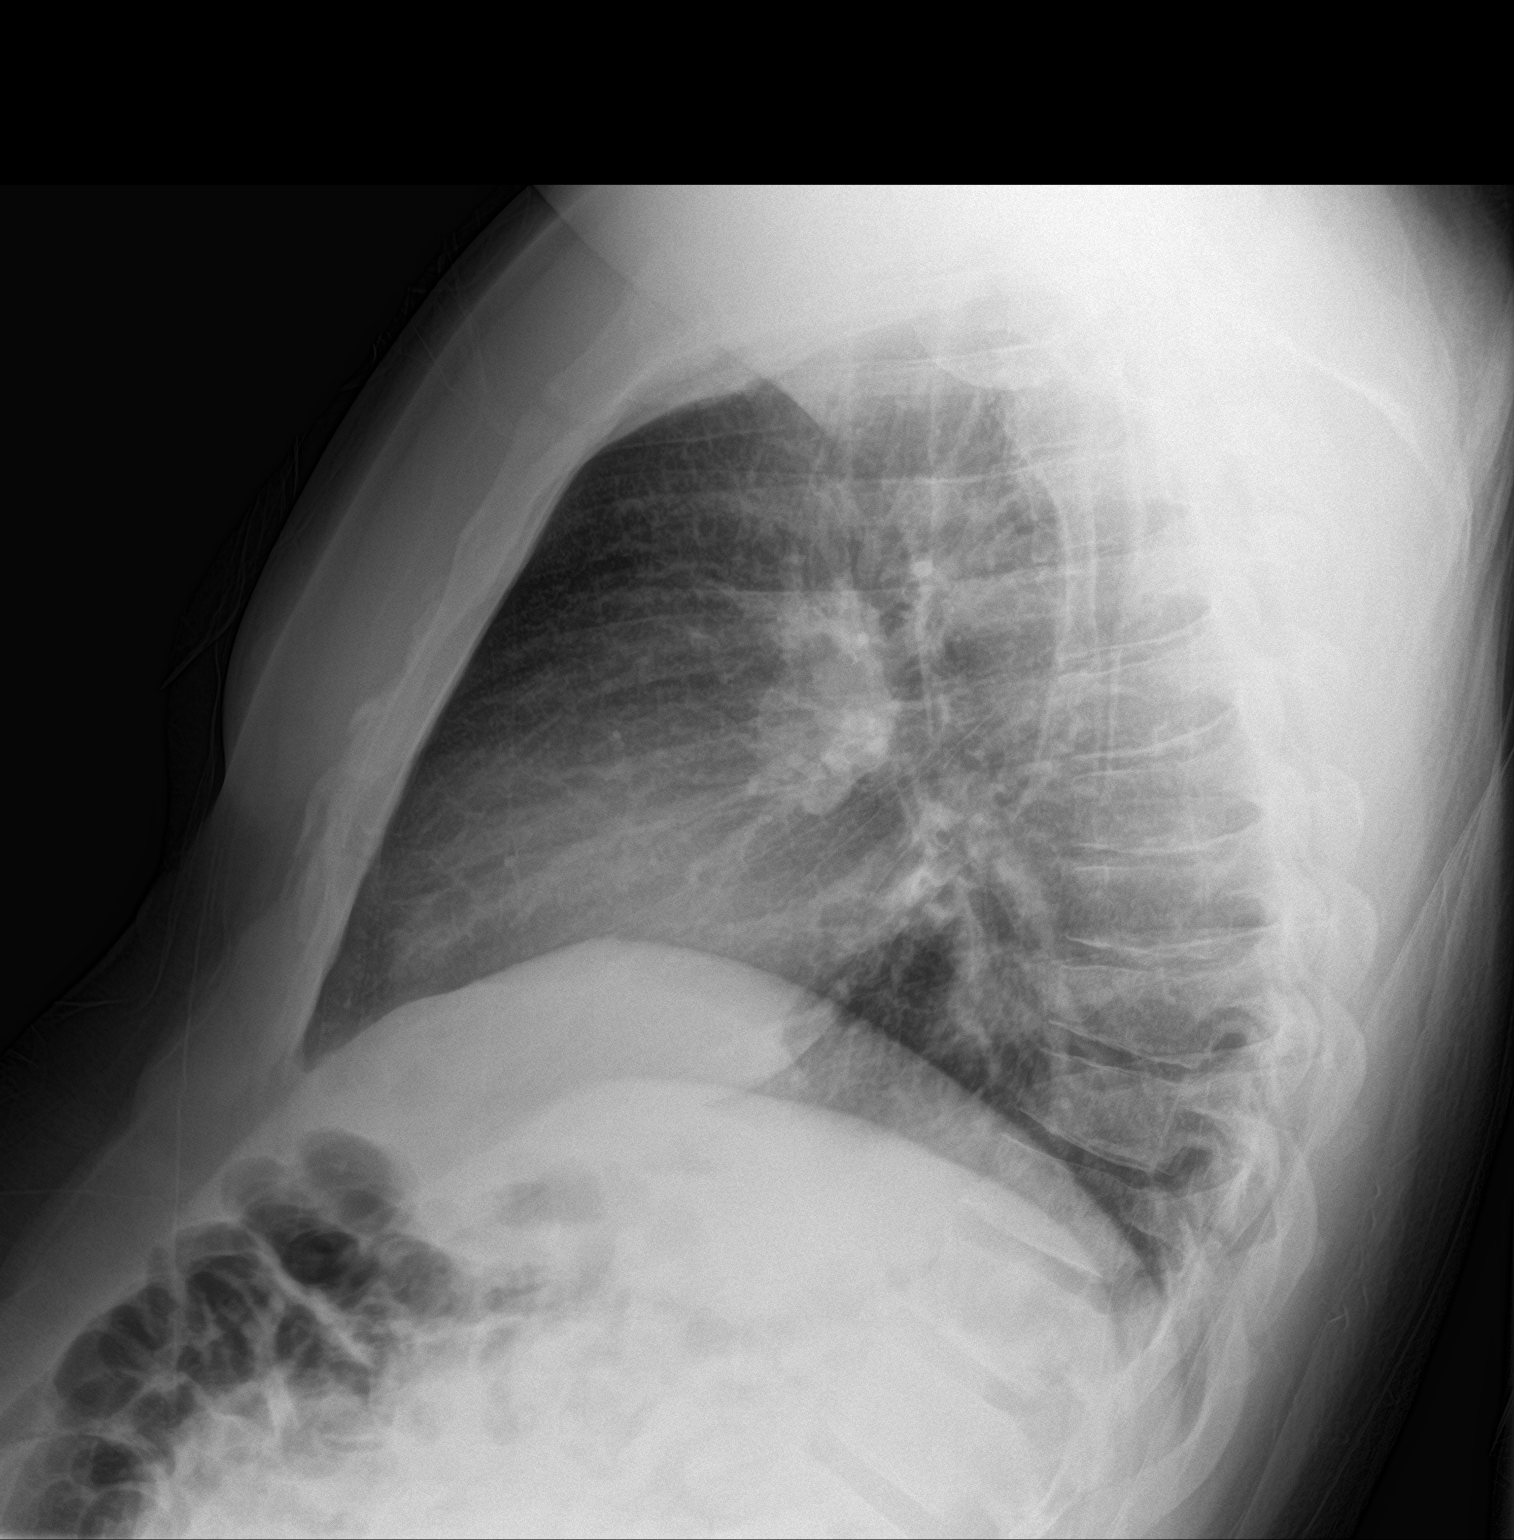

[chest ap]
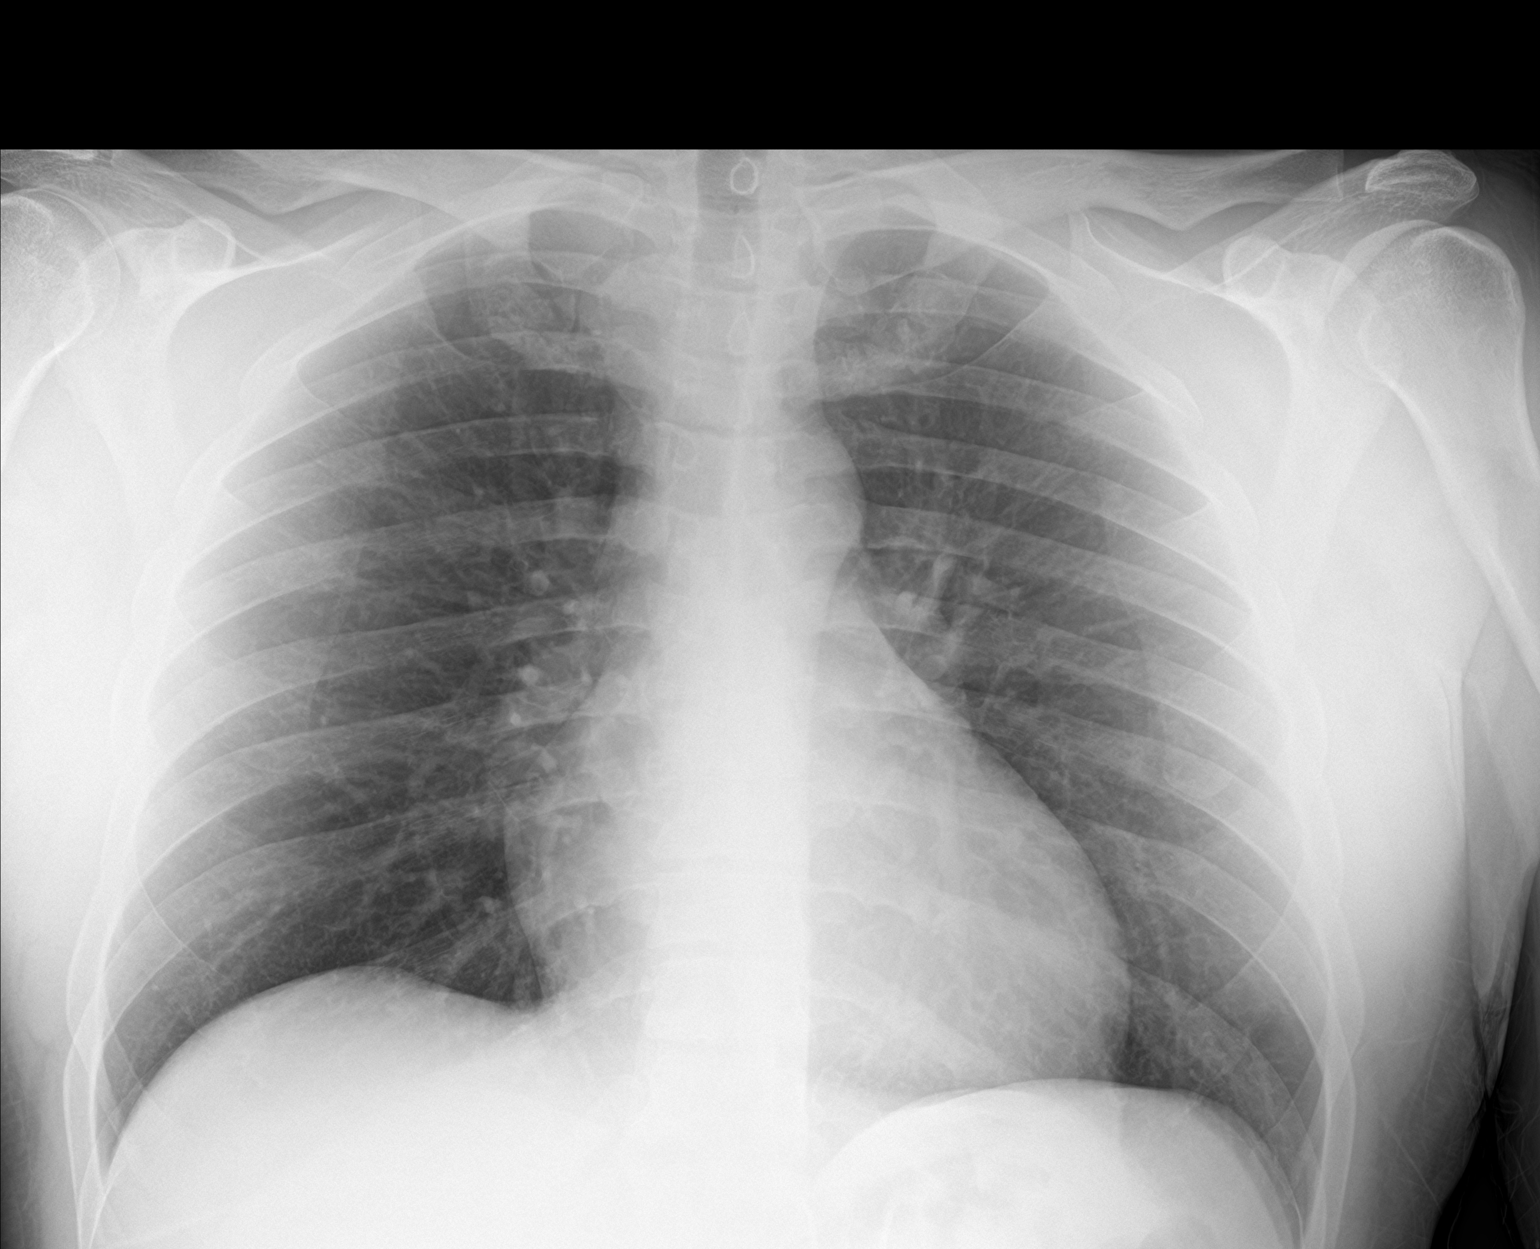

[2 of 2 positions shown; findings below may reference images not displayed]

FINDINGS: Heart size and mediastinal contours are within normal limits. Lungs
are clear. No pleural effusion or pneumothorax is seen. Osseous
structures about the chest are unremarkable.
IMPRESSION: Negative exam.  No evidence of pneumonia or pulmonary edema.
